# Patient Record
Sex: Male | Born: 1970 | Race: Black or African American | Hispanic: No | Marital: Married | Smoking: Never smoker
Health system: Southern US, Community
[De-identification: ages and names within clinical notes are randomized; demographics above are authoritative.]

## PROBLEM LIST (undated history)

## (undated) DIAGNOSIS — I10 Essential (primary) hypertension: Secondary | ICD-10-CM

---

## 1998-03-28 ENCOUNTER — Emergency Department (HOSPITAL_COMMUNITY): Admission: EM | Admit: 1998-03-28 | Discharge: 1998-03-28 | Payer: Self-pay | Admitting: Emergency Medicine

## 1998-03-28 ENCOUNTER — Encounter: Payer: Self-pay | Admitting: Emergency Medicine

## 2004-01-24 ENCOUNTER — Emergency Department (HOSPITAL_COMMUNITY): Admission: EM | Admit: 2004-01-24 | Discharge: 2004-01-24 | Payer: Self-pay | Admitting: Emergency Medicine

## 2008-01-06 ENCOUNTER — Emergency Department (HOSPITAL_COMMUNITY): Admission: EM | Admit: 2008-01-06 | Discharge: 2008-01-06 | Payer: Self-pay | Admitting: Emergency Medicine

## 2009-07-05 IMAGING — CT CT ABDOMEN W/ CM
1 of 3 series · 14 of 32 positions shown, 19 images · IV contrast (agent unspecified)
Comparison: 01/24/2004

CT ABDOMEN

CLINICAL DATA: Right lower quadrant pain.  Nausea.  Diarrhea.
Nephrolithiasis.

CT ABDOMEN AND PELVIS WITH CONTRAST
TECHNIQUE: Multidetector CT imaging of the abdomen and pelvis was
performed using the standard protocol following bolus
administration of intravenous contrast.
Contrast: 100 ml [id number] and oral contrast

[Series 2: abd_pel 5.0 b40f st · axial · 0.70mm/px · z∈[-427,-2]mm · 14 of 97 slices shown, 19 images]
[im 6/97  soft-tissue]
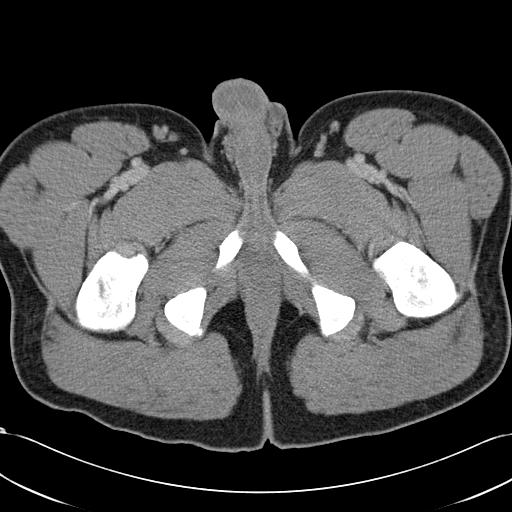
[im 6/97  bone]
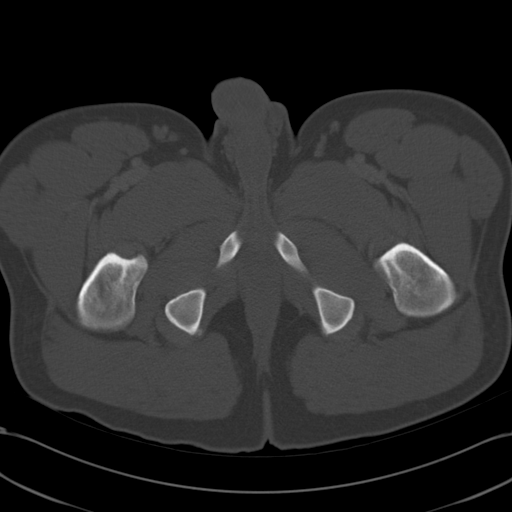
[im 11/97  soft-tissue]
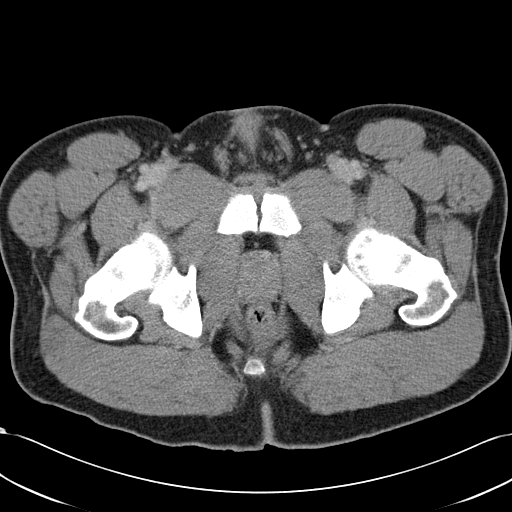
[im 22/97  soft-tissue]
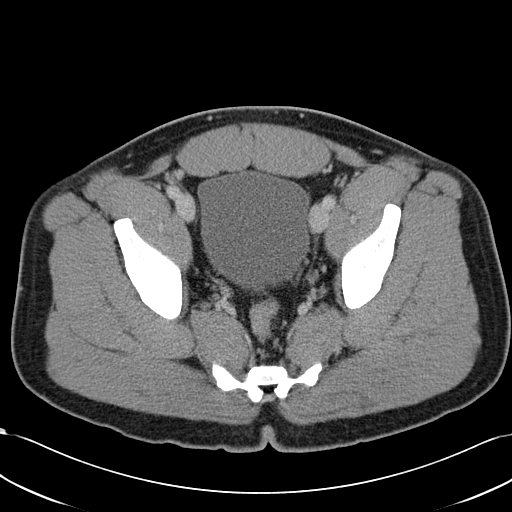
[im 27/97  soft-tissue]
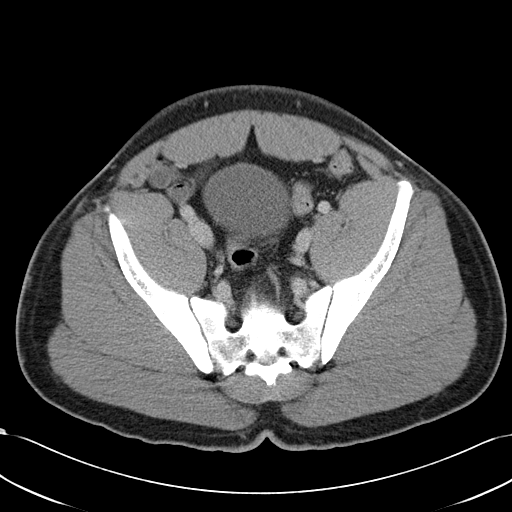
[im 33/97  soft-tissue]
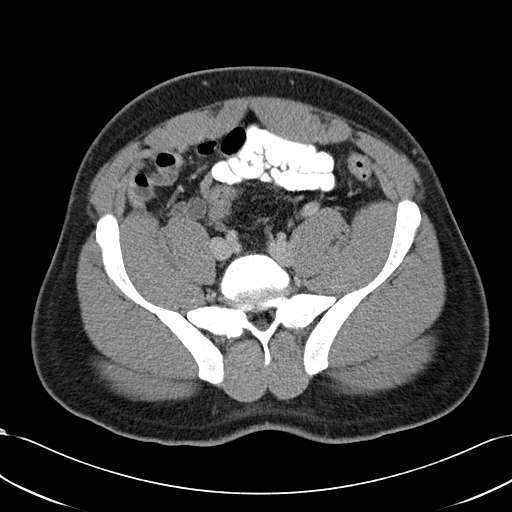
[im 43/97  soft-tissue]
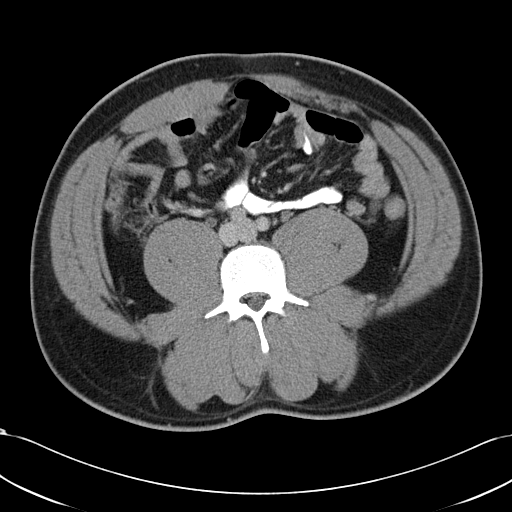
[im 49/97  soft-tissue]
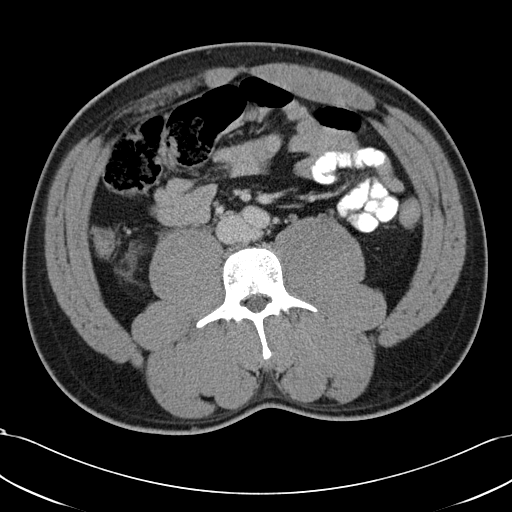
[im 54/97  soft-tissue]
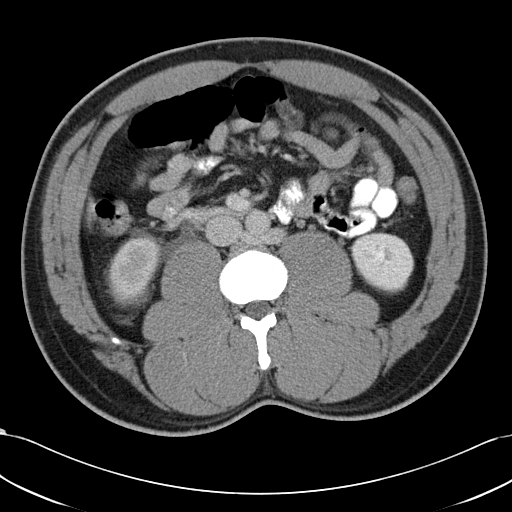
[im 65/97  soft-tissue]
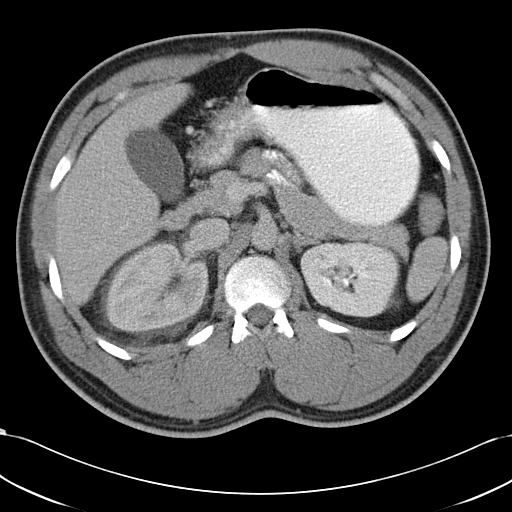
[im 65/97  bone]
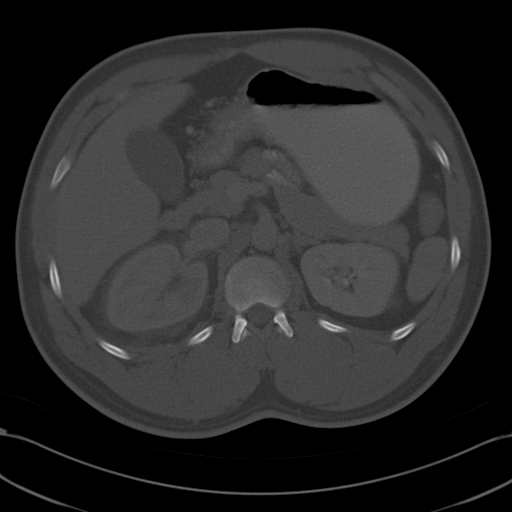
[im 70/97  soft-tissue]
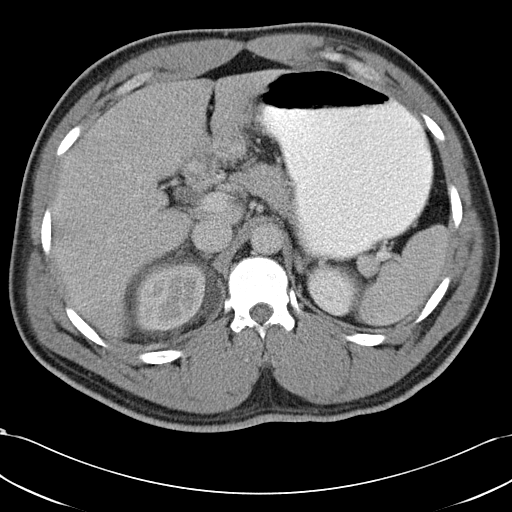
[im 75/97  soft-tissue]
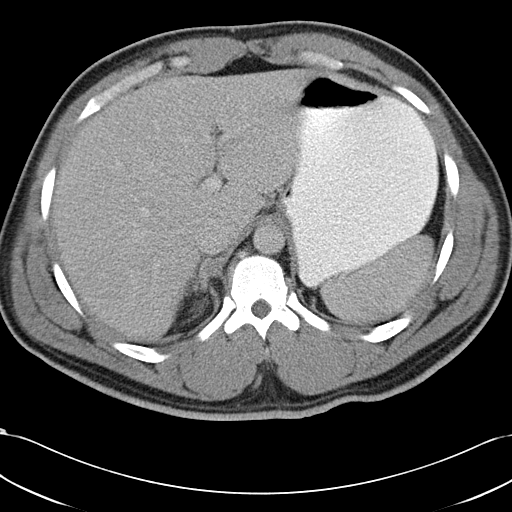
[im 75/97  lung]
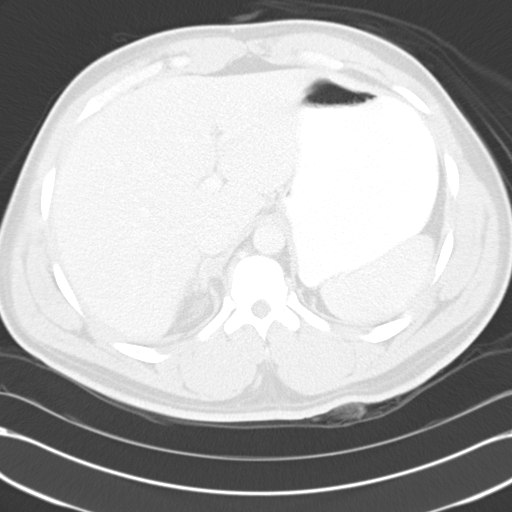
[im 81/97  lung]
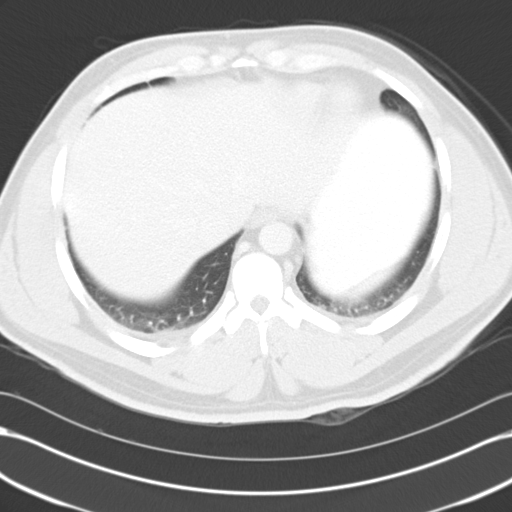
[im 86/97  soft-tissue]
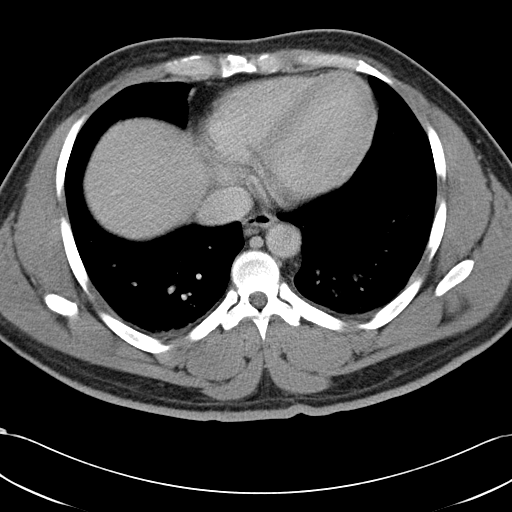
[im 86/97  lung]
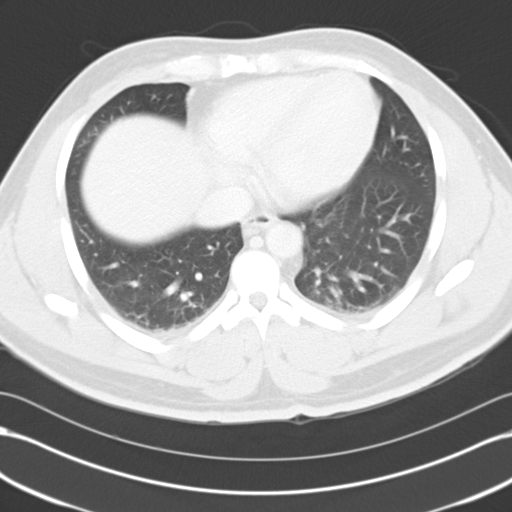
[im 91/97  soft-tissue]
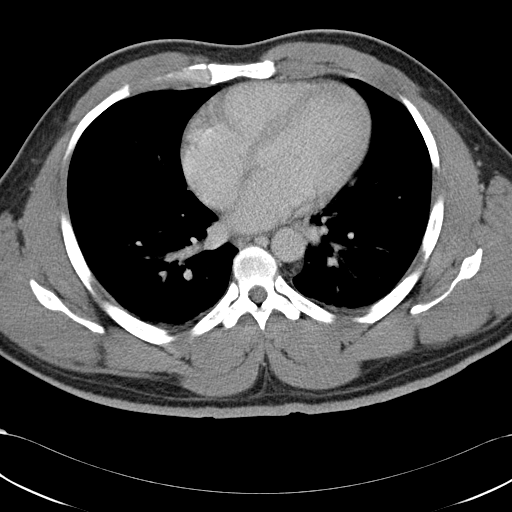
[im 91/97  lung]
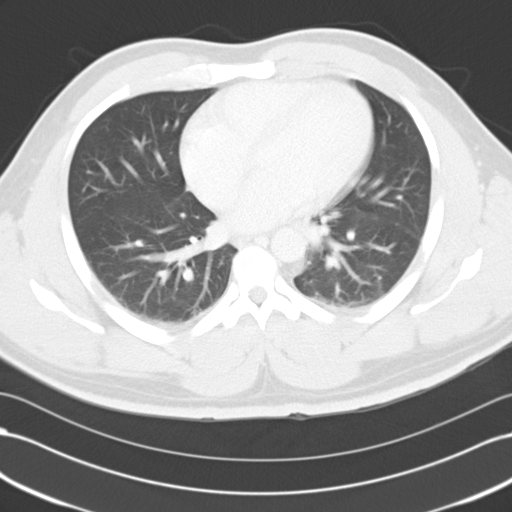

[14 of 32 positions shown; findings below may reference images not displayed]

FINDINGS: Moderate right hydronephrosis is seen as well as mild
perinephric fluid, consistent with acute ureteral obstruction.
Left kidney is normal in appearance.  Incidental note is made of a
retroaortic left renal vein, which is normal variant.  The other
abdominal parenchymal organs are normal appearance.  There is no
evidence of soft tissue mass or adenopathy.
IMPRESSION: Moderate right hydronephrosis and mild perinephric fluid,
consistent with acute ureteral obstruction.  See pelvis report
below.

CT PELVIS
FINDINGS: Mild right ureteral dilatation is seen.  A distal right
ureteral calculus is seen measuring 2 x 3 mm.

There is no evidence of pelvic mass or inflammatory process.  No
abnormal fluid collections are seen in the pelvis.  ureteral
IMPRESSION: 2 x 3 mm distal right ureteral calculus.

## 2011-02-28 LAB — DIFFERENTIAL
Basophils Relative: 0
Lymphs Abs: 0.8
Monocytes Absolute: 1
Monocytes Relative: 8
Neutro Abs: 10.4 — ABNORMAL HIGH

## 2011-02-28 LAB — URINALYSIS, ROUTINE W REFLEX MICROSCOPIC
Bilirubin Urine: NEGATIVE
Glucose, UA: NEGATIVE
Hgb urine dipstick: NEGATIVE
Ketones, ur: NEGATIVE
Nitrite: NEGATIVE
Protein, ur: NEGATIVE
Specific Gravity, Urine: 1.024
Urobilinogen, UA: 0.2
pH: 7

## 2011-02-28 LAB — CBC
Hemoglobin: 16
MCHC: 33.4
MCV: 80.3
RBC: 5.95 — ABNORMAL HIGH
WBC: 12.3 — ABNORMAL HIGH

## 2011-02-28 LAB — BASIC METABOLIC PANEL
CO2: 25
Calcium: 9.5
Chloride: 106
GFR calc Af Amer: >60
Sodium: 138

## 2021-02-05 ENCOUNTER — Encounter (HOSPITAL_COMMUNITY): Payer: Self-pay | Admitting: *Deleted

## 2021-02-05 ENCOUNTER — Emergency Department (HOSPITAL_COMMUNITY)
Admission: EM | Admit: 2021-02-05 | Discharge: 2021-02-05 | Disposition: A | Discharge status: Home / Self Care | Payer: BC Managed Care – PPO | Attending: Emergency Medicine | Admitting: Emergency Medicine

## 2021-02-05 ENCOUNTER — Other Ambulatory Visit: Payer: Self-pay

## 2021-02-05 DIAGNOSIS — L03211 Cellulitis of face: Secondary | ICD-10-CM | POA: Diagnosis not present

## 2021-02-05 DIAGNOSIS — I10 Essential (primary) hypertension: Secondary | ICD-10-CM | POA: Insufficient documentation

## 2021-02-05 DIAGNOSIS — R220 Localized swelling, mass and lump, head: Secondary | ICD-10-CM | POA: Diagnosis present

## 2021-02-05 HISTORY — DX: Essential (primary) hypertension: I10

## 2021-02-05 MED ORDER — STERILE WATER FOR INJECTION IJ SOLN
INTRAMUSCULAR | Status: AC
  Filled 2021-02-05: qty 10

## 2021-02-05 MED ORDER — CEFTRIAXONE SODIUM 1 G IJ SOLR
1.0000 g | Freq: Once | INTRAMUSCULAR | Status: AC
Start: 2021-02-05 — End: 2021-02-05
  Administered 2021-02-05: 1 g via INTRAMUSCULAR
  Filled 2021-02-05: qty 10

## 2021-02-05 MED ORDER — LIDOCAINE HCL (PF) 1 % IJ SOLN
5.0000 mL | Freq: Once | INTRAMUSCULAR | Status: AC
  Administered 2021-02-05: 5 mL
  Filled 2021-02-05: qty 30

## 2021-02-05 NOTE — ED Triage Notes (Signed)
Swollen red raised area between eyes onset 2 days ago, states he was seen at urgent care in [place] and was started on antibiotics yesterday. States not improvement today

## 2021-02-05 NOTE — Discharge Instructions (Addendum)
Continue antibiotics.  Warm moist compresses 20 minutes 4 times a day.  See the dermatologist for removal of cyst

## 2021-02-06 NOTE — ED Provider Notes (Signed)
[facility] EMERGENCY DEPARTMENT Provider Note   CSN: [id number] Arrival date & time: 02/05/21  1251     History Chief Complaint  Patient presents with   Facial Swelling    Ruben Jones is a 50 y.o. male.  The history is provided by the patient. No language interpreter was used.  Abscess Location:  Face Size:  2 Abscess quality: painful and redness   Red streaking: no   Duration:  1 week Pain details:    Quality:  Dull   Duration:  1 week   Timing:  Constant   Progression:  Worsening Relieved by:  Nothing Worsened by:  Nothing Ineffective treatments:  None tried Risk factors: prior abscess     Pt recently had an I and D to his back.  Pt complains of pain and swelling to forehaeda  Pt is on keflex   Past Medical History:  Diagnosis Date   Hypertension     There are no problems to display for this patient.   History reviewed. No pertinent surgical history.     No family history on file.  Social History   Tobacco Use   Smoking status: Never   Smokeless tobacco: Never  Substance Use Topics   Alcohol use: Not Currently   Drug use: Never    Home Medications Prior to Admission medications   Not on File    Allergies    Patient has no known allergies.  Review of Systems   Review of Systems  All other systems reviewed and are negative.  Physical Exam Updated Vital Signs BP (!) 157/96   Pulse 91   Temp 99.1 F (37.3 C)   Resp 17   Ht 5\' 5"  (1.651 m)   Wt 97.5 kg   SpO2 97%   BMI 35.78 kg/m   Physical Exam Vitals and nursing note reviewed.  Constitutional:      Appearance: He is well-developed.  HENT:     Head: Normocephalic.  Pulmonary:     Effort: Pulmonary effort is normal.  Abdominal:     General: There is no distension.  Musculoskeletal:        General: Normal range of motion.     Cervical back: Normal range of motion.  Skin:    General: Skin is warm.     Comments: Cm swollen area mid forehead   Neurological:     Mental  Status: He is alert and oriented to person, place, and time.    ED Results / Procedures / Treatments   Labs (all labs ordered are listed, but only abnormal results are displayed) Labs Reviewed - No data to display  EKG None  Radiology No results found.  Procedures . Incision and Drainage  Date/Time: 02/06/2021 1:34 PM Performed by: 04/08/2021, PA-C Authorized by: Elson Areas, PA-C   Consent:    Consent obtained:  Verbal   Consent given by:  Patient   Risks, benefits, and alternatives were discussed: yes     Risks discussed:  Incomplete drainage   Alternatives discussed:  No treatment Universal protocol:    Procedure explained and questions answered to patient or proxy's satisfaction: yes     Patient identity confirmed:  Verbally with patient Location:    Type:  Cyst   Size:  2   Location:  Head   Head location:  Face Pre-procedure details:    Skin preparation:  Antiseptic wash Sedation:    Sedation type:  None Anesthesia:    Anesthesia method:  Local  infiltration Procedure details:    Ultrasound guidance: no     Needle aspiration: no     Incision types:  Single straight   Drainage amount:  Scant   Wound treatment:  Wound left open   Packing materials:  None Post-procedure details:    Procedure completion:  Tolerated  Small amount of drainage,   Medications Ordered in ED Medications  lidocaine (PF) (XYLOCAINE) 1 % injection 5 mL (5 mLs Infiltration Given 02/05/21 1504)  cefTRIAXone (ROCEPHIN) injection 1 g (1 g Intramuscular Given 02/05/21 1607)    ED Course  I have reviewed the triage vital signs and the nursing notes.  Pertinent labs & imaging results that were available during my care of the patient were reviewed by me and considered in my medical decision making (see chart for details).    MDM Rules/Calculators/A&P                           MDM: Pt advised to follow up with dermatologist, take antibiotics as directed  Final Clinical  Impression(s) / ED Diagnoses Final diagnoses:  Facial cellulitis    Rx / DC Orders ED Discharge Orders     None     An After Visit Summary was printed and given to the patient.    Osie Cheeks 02/06/21 1336    Gerhard Munch, MD 02/06/21 1432
